# Patient Record
Sex: Female | Born: 1960 | Hispanic: No | Marital: Married | Smoking: Never smoker
Health system: Southern US, Community
[De-identification: ages and names within clinical notes are randomized; demographics above are authoritative.]

## PROBLEM LIST (undated history)

## (undated) DIAGNOSIS — M5136 Other intervertebral disc degeneration, lumbar region: Secondary | ICD-10-CM

## (undated) DIAGNOSIS — D649 Anemia, unspecified: Secondary | ICD-10-CM

## (undated) DIAGNOSIS — J189 Pneumonia, unspecified organism: Secondary | ICD-10-CM

## (undated) DIAGNOSIS — M51369 Other intervertebral disc degeneration, lumbar region without mention of lumbar back pain or lower extremity pain: Secondary | ICD-10-CM

## (undated) DIAGNOSIS — M5126 Other intervertebral disc displacement, lumbar region: Secondary | ICD-10-CM

## (undated) HISTORY — PX: OTHER SURGICAL HISTORY: SHX169

## (undated) HISTORY — PX: TUBAL LIGATION: SHX77

## (undated) HISTORY — PX: ANKLE SURGERY: SHX546

## (undated) HISTORY — PX: ABDOMINAL HYSTERECTOMY: SHX81

---

## 2008-12-09 ENCOUNTER — Emergency Department (HOSPITAL_COMMUNITY): Admission: EM | Admit: 2008-12-09 | Discharge: 2008-12-09 | Payer: Self-pay | Admitting: Emergency Medicine

## 2013-03-18 ENCOUNTER — Emergency Department (HOSPITAL_BASED_OUTPATIENT_CLINIC_OR_DEPARTMENT_OTHER)
Admission: EM | Admit: 2013-03-18 | Discharge: 2013-03-18 | Disposition: A | Discharge status: Home / Self Care | Attending: Emergency Medicine | Admitting: Emergency Medicine

## 2013-03-18 ENCOUNTER — Emergency Department (HOSPITAL_BASED_OUTPATIENT_CLINIC_OR_DEPARTMENT_OTHER)

## 2013-03-18 ENCOUNTER — Encounter (HOSPITAL_BASED_OUTPATIENT_CLINIC_OR_DEPARTMENT_OTHER): Payer: Self-pay

## 2013-03-18 DIAGNOSIS — D259 Leiomyoma of uterus, unspecified: Secondary | ICD-10-CM

## 2013-03-18 DIAGNOSIS — N898 Other specified noninflammatory disorders of vagina: Secondary | ICD-10-CM | POA: Insufficient documentation

## 2013-03-18 DIAGNOSIS — D649 Anemia, unspecified: Secondary | ICD-10-CM

## 2013-03-18 HISTORY — DX: Anemia, unspecified: D64.9

## 2013-03-18 LAB — BASIC METABOLIC PANEL
CO2: 24 mEq/L (ref 19–32)
Chloride: 107 mEq/L (ref 96–112)
Glucose, Bld: 90 mg/dL (ref 70–99)
Potassium: 3.9 mEq/L (ref 3.5–5.1)
Sodium: 140 mEq/L (ref 135–145)

## 2013-03-18 LAB — CBC WITH DIFFERENTIAL/PLATELET
Eosinophils Relative: 1 % (ref 0–5)
Lymphocytes Relative: 42 % (ref 12–46)
Lymphs Abs: 2.2 10*3/uL (ref 0.7–4.0)
MCV: 82.9 fL (ref 78.0–100.0)
Neutro Abs: 2.6 10*3/uL (ref 1.7–7.7)
Neutrophils Relative %: 48 % (ref 43–77)
Platelets: 280 10*3/uL (ref 150–400)
RBC: 3.16 MIL/uL — ABNORMAL LOW (ref 3.87–5.11)
WBC: 5.4 10*3/uL (ref 4.0–10.5)

## 2013-03-18 LAB — URINALYSIS, ROUTINE W REFLEX MICROSCOPIC
Bilirubin Urine: NEGATIVE
Glucose, UA: NEGATIVE mg/dL
Ketones, ur: 15 mg/dL — AB
Leukocytes, UA: NEGATIVE
Nitrite: NEGATIVE
Protein, ur: NEGATIVE mg/dL
Specific Gravity, Urine: 1.025 (ref 1.005–1.030)
Urobilinogen, UA: 1 mg/dL (ref 0.0–1.0)
pH: 6 (ref 5.0–8.0)

## 2013-03-18 LAB — URINE MICROSCOPIC-ADD ON

## 2013-03-18 LAB — WET PREP, GENITAL
Trich, Wet Prep: NONE SEEN
Yeast Wet Prep HPF POC: NONE SEEN

## 2013-03-18 MED ORDER — MORPHINE SULFATE 4 MG/ML IJ SOLN
4.0000 mg | Freq: Once | INTRAMUSCULAR | Status: AC
  Administered 2013-03-18: 4 mg via INTRAVENOUS
  Filled 2013-03-18: qty 1

## 2013-03-18 MED ORDER — HYDROCODONE-ACETAMINOPHEN 5-325 MG PO TABS: 2.0000 | ORAL_TABLET | ORAL | Status: DC | PRN

## 2013-03-18 MED ORDER — ONDANSETRON HCL 4 MG/2ML IJ SOLN
4.0000 mg | Freq: Once | INTRAMUSCULAR | Status: AC
  Administered 2013-03-18: 4 mg via INTRAVENOUS
  Filled 2013-03-18: qty 2

## 2013-03-18 NOTE — ED Provider Notes (Signed)
History     CSN: [id number]  Arrival date & time 03/18/13  1028   First MD Initiated Contact with Patient 03/18/13 1204      Chief Complaint  Patient presents with  . Abdominal Cramping  . Vaginal Bleeding    (Consider location/radiation/quality/duration/timing/severity/associated sxs/prior treatment) HPI Comments: Pt states that she is having intermittent lower abdominal pain with clotting which is abnormal for her:pt states that her period has been on going for 13 days:pt states that she has not had to use more then 6 tampons in 24 hours:no n/v/d, fever:pt states that she had an iron infusion on the 14th and she wasn't sure if this is related  Patient is a 52 y.o. female presenting with cramps and vaginal bleeding. The history is provided by the patient. No language interpreter was used.  Abdominal Cramping This is a new problem. The current episode started 1 to 4 weeks ago. The problem occurs intermittently. The problem has been gradually worsening. Pertinent negatives include no fever or urinary symptoms. Nothing aggravates the symptoms. She has tried nothing for the symptoms.  Vaginal Bleeding Pertinent negatives include no fever or urinary symptoms.    Past Medical History  Diagnosis Date  . Anemia     Past Surgical History  Procedure Laterality Date  . Tubal ligation    . Ankle surgery    . Arm surgery      No family history on file.  History  Substance Use Topics  . Smoking status: Never Smoker   . Smokeless tobacco: Not on file  . Alcohol Use: No    OB History   Grav Para Term Preterm Abortions TAB SAB Ect Mult Living                  Review of Systems  Constitutional: Negative for fever.  Respiratory: Negative.   Cardiovascular: Negative.   Genitourinary: Positive for vaginal bleeding.    Allergies  Review of patient's allergies indicates no known allergies.  Home Medications   Current Outpatient Rx  Name  Route  Sig  Dispense  Refill  .  Multiple Vitamins-Minerals (MULTIVITAMIN PO)   Oral   Take by mouth.           BP 111/68  Pulse 72  Temp(Src) 97.4 F (36.3 C) (Oral)  Resp 16  Ht 5\' 5"  (1.651 m)  Wt 138 lb (62.596 kg)  BMI 22.96 kg/m2  SpO2 100%  LMP 03/06/2013  Physical Exam  Nursing note and vitals reviewed. Constitutional: She is oriented to person, place, and time. She appears well-developed and well-nourished.  HENT:  Head: Normocephalic and atraumatic.  Eyes: Conjunctivae and EOM are normal.  Neck: Neck supple.  Cardiovascular: Normal rate and regular rhythm.   Pulmonary/Chest: Effort normal and breath sounds normal.  Abdominal: Soft. Bowel sounds are normal.  llq tenderness  Genitourinary:  Small amount of blood in vaginal vault  Musculoskeletal: Normal range of motion.  Neurological: She is alert and oriented to person, place, and time.  Skin: Skin is warm and dry.  Psychiatric: She has a normal mood and affect.    ED Course  Procedures (including critical care time)  Labs Reviewed  WET PREP, GENITAL - Abnormal; Notable for the following:    Clue Cells Wet Prep HPF POC MODERATE (*)    WBC, Wet Prep HPF POC MODERATE (*)    All other components within normal limits  URINALYSIS, ROUTINE W REFLEX MICROSCOPIC - Abnormal; Notable for the following:  Hgb urine dipstick TRACE (*)    Ketones, ur 15 (*)    All other components within normal limits  CBC WITH DIFFERENTIAL - Abnormal; Notable for the following:    RBC 3.16 (*)    Hemoglobin 8.0 (*)    HCT 26.2 (*)    MCH 25.3 (*)    RDW 20.8 (*)    All other components within normal limits  GC/CHLAMYDIA PROBE AMP  PREGNANCY, URINE  URINE MICROSCOPIC-ADD ON  BASIC METABOLIC PANEL   US Transvaginal Non-ob  03/18/2013  *RADIOLOGY REPORT*  Clinical Data: Pelvic pain, vaginal bleeding.  TRANSABDOMINAL AND TRANSVAGINAL ULTRASOUND OF PELVIS  Technique:  Both transabdominal and transvaginal ultrasound examinations of the pelvis were performed  including evaluation of the uterus, ovaries, adnexal regions, and pelvic cul-de-sac.  Comparison: None.  Findings:  Uterus: 11.4 x 8.9 x 9.4 cm.  Focal right fundal intramural solid lesion compatible with fibroid measuring up to 3.2 cm.  Endometrium: There is thickened, heterogeneous endometrium, measuring up to 4.9 cm in thickness.  Mixed echogenicity material within the endometrium could represent blood clot.  Right Ovary: 2.9 x 2.3 x 2.2 cm. Normal size and echotexture.  No adnexal masses.  Left Ovary: Not visualized.  No adnexal masses.  Other Findings:  Trace free fluid.  IMPRESSION: Markedly thickened, heterogeneous endometrium, question blood products within endometrial cavity.  Small right fundal fibroid   Original Report Authenticated By: Charlett Nose, M.D.      1. Uterine fibroid   2. Anemia       MDM  Pt is comfortable at this time:pt is okay to follow up with regional physicians:obtained records from [place] and pt had hg of 8.3 before he iron transfusion on 3/12        Teressa Lower, NP 03/18/13 1450

## 2013-03-18 NOTE — ED Provider Notes (Signed)
Medical screening examination/treatment/procedure(s) were performed by non-physician practitioner and as supervising physician I was immediately available for consultation/collaboration.   Dione Booze, MD 03/18/13 1504

## 2013-03-18 NOTE — ED Notes (Signed)
Pt reports vaginal bleeding and abdominal cramping that started on 03/09/2013.  She also reports passing clots.

## 2015-04-25 ENCOUNTER — Encounter (HOSPITAL_BASED_OUTPATIENT_CLINIC_OR_DEPARTMENT_OTHER): Payer: Self-pay

## 2015-04-25 ENCOUNTER — Emergency Department (HOSPITAL_BASED_OUTPATIENT_CLINIC_OR_DEPARTMENT_OTHER)
Admission: EM | Admit: 2015-04-25 | Discharge: 2015-04-25 | Disposition: A | Discharge status: Home / Self Care | Attending: Emergency Medicine | Admitting: Emergency Medicine

## 2015-04-25 DIAGNOSIS — M545 Low back pain: Secondary | ICD-10-CM | POA: Diagnosis present

## 2015-04-25 DIAGNOSIS — R109 Unspecified abdominal pain: Secondary | ICD-10-CM | POA: Insufficient documentation

## 2015-04-25 DIAGNOSIS — Z862 Personal history of diseases of the blood and blood-forming organs and certain disorders involving the immune mechanism: Secondary | ICD-10-CM | POA: Diagnosis not present

## 2015-04-25 LAB — URINALYSIS, ROUTINE W REFLEX MICROSCOPIC
Bilirubin Urine: NEGATIVE
Glucose, UA: NEGATIVE mg/dL
Hgb urine dipstick: NEGATIVE
Ketones, ur: NEGATIVE mg/dL
Leukocytes, UA: NEGATIVE
NITRITE: NEGATIVE
PH: 6 (ref 5.0–8.0)
Protein, ur: NEGATIVE mg/dL
SPECIFIC GRAVITY, URINE: 1.006 (ref 1.005–1.030)
Urobilinogen, UA: 1 mg/dL (ref 0.0–1.0)

## 2015-04-25 MED ORDER — DIAZEPAM 5 MG PO TABS
5.0000 mg | ORAL_TABLET | Freq: Once | ORAL | Status: AC
  Administered 2015-04-25: 5 mg via ORAL
  Filled 2015-04-25: qty 1

## 2015-04-25 MED ORDER — DIAZEPAM 5 MG PO TABS: 5.0000 mg | ORAL_TABLET | Freq: Four times a day (QID) | ORAL | Status: DC | PRN

## 2015-04-25 MED ORDER — ACETAMINOPHEN 325 MG PO TABS
650.0000 mg | ORAL_TABLET | Freq: Once | ORAL | Status: AC
  Administered 2015-04-25: 650 mg via ORAL
  Filled 2015-04-25: qty 2

## 2015-04-25 NOTE — ED Notes (Signed)
MD at bedside. 

## 2015-04-25 NOTE — ED Notes (Signed)
Pt reports back "spasms" on left side of back since Friday. Denies injrury. Reports she does work out. Also denies any urinary symptoms.

## 2015-04-25 NOTE — ED Provider Notes (Signed)
CSN: [id number]     Arrival date & time 04/25/15  0175 History   First MD Initiated Contact with Patient 04/25/15 [phone]     Chief Complaint  Patient presents with  . Back Pain     (Consider location/radiation/quality/duration/timing/severity/associated sxs/prior Treatment) HPI Comments: 54 year old female with no significant medical history, nonsmoker, no alcohol, exercises regularly presents with left lower back pain worse with movement intermittent since Friday. Pain improves with NSAIDs. No leg weakness numbness or urinary symptoms. No personal or family history of kidney stones. No abdominal surgeries. 9 your symptoms. Intermittent spasms. Patient has been doing specific workouts/pull-ups recently.  Patient is a 54 y.o. female presenting with back pain. The history is provided by the patient.  Back Pain Associated symptoms: no abdominal pain, no chest pain, no dysuria, no fever, no headaches, no numbness and no weakness     Past Medical History  Diagnosis Date  . Anemia    Past Surgical History  Procedure Laterality Date  . Tubal ligation    . Ankle surgery    . Arm surgery     No family history on file. History  Substance Use Topics  . Smoking status: Never Smoker   . Smokeless tobacco: Not on file  . Alcohol Use: No   OB History    No data available     Review of Systems  Constitutional: Negative for fever and chills.  HENT: Negative for congestion.   Eyes: Negative for visual disturbance.  Respiratory: Negative for shortness of breath.   Cardiovascular: Negative for chest pain.  Gastrointestinal: Negative for nausea, vomiting and abdominal pain.  Genitourinary: Negative for dysuria and flank pain.  Musculoskeletal: Positive for back pain. Negative for neck pain and neck stiffness.  Skin: Negative for rash.  Neurological: Negative for weakness, light-headedness, numbness and headaches.      Allergies  Review of patient's allergies indicates no known  allergies.  Home Medications   Prior to Admission medications   Medication Sig Start Date End Date Taking? Authorizing Provider  diazepam (VALIUM) 5 MG tablet Take 1 tablet (5 mg total) by mouth every 6 (six) hours as needed for muscle spasms (spasms). 04/25/15   Elnora Morrison, MD  HYDROcodone-acetaminophen (NORCO/VICODIN) 5-325 MG per tablet Take 2 tablets by mouth every 4 (four) hours as needed for pain. 03/18/13   Glendell Docker, NP  Multiple Vitamins-Minerals (MULTIVITAMIN PO) Take by mouth.    Historical Provider, MD   BP 105/66 mmHg  Pulse 54  Temp(Src) 97.9 F (36.6 C) (Oral)  Resp 14  Ht 5\' 6"  (1.676 m)  Wt 130 lb (58.968 kg)  BMI 20.99 kg/m2  SpO2 100%  LMP 03/06/2013 Physical Exam  Constitutional: She is oriented to person, place, and time. She appears well-developed and well-nourished.  HENT:  Head: Normocephalic and atraumatic.  Eyes: Conjunctivae are normal. Right eye exhibits no discharge. Left eye exhibits no discharge.  Neck: Normal range of motion. Neck supple. No tracheal deviation present.  Cardiovascular: Normal rate and regular rhythm.   Pulmonary/Chest: Effort normal and breath sounds normal.  Abdominal: Soft. She exhibits no distension. There is no tenderness. There is no guarding.  Musculoskeletal: She exhibits tenderness. She exhibits no edema.  Currently patient has minimal pain, mild discomfort left paraspinal region. No midline tenderness.  Neurological: She is alert and oriented to person, place, and time. GCS eye subscore is 4. GCS verbal subscore is 5. GCS motor subscore is 6.  Patient has 5+ strength with flexion extension of hips  knees and great toes. Sensation intact to major nerves bilateral lower extremities.  Skin: Skin is warm. No rash noted.  Psychiatric: She has a normal mood and affect.  Nursing note and vitals reviewed.   ED Course  Procedures (including critical care time) Emergency Focused Ultrasound Exam Limited retroperitoneal  ultrasound of kidneys  Performed and interpreted by Dr. Reather Converse Indication: flank pain Focused abdominal ultrasound with both kidneys imaged in transverse and longitudinal planes in real-time. Interpretation: no hydronephrosis visualized.   Images archived electronically  CPT Code: [phone] (limited retroperitoneal)  Labs Review Labs Reviewed  URINALYSIS, ROUTINE W REFLEX MICROSCOPIC    Imaging Review No results found.   EKG Interpretation None      MDM   Final diagnoses:  Left flank pain   Patient presents with clinical concern for musculoskeletal pain with recent workout and pain with range of motion and intermittent. Bedside ultrasound did not show significant hydronephrosis. Urinalysis pending. Discussed risks and benefits of CT scan, plan to hold on CT scan today with low pretest probability and treat supportively with close outpatient follow-up.  Results and differential diagnosis were discussed with the patient/parent/guardian. Close follow up outpatient was discussed, comfortable with the plan.   Medications  diazepam (VALIUM) tablet 5 mg (5 mg Oral Given 04/25/15 0814)  acetaminophen (TYLENOL) tablet 650 mg (650 mg Oral Given 04/25/15 0814)    Filed Vitals:   04/25/15 0750  BP: 105/66  Pulse: 54  Temp: 97.9 F (36.6 C)  TempSrc: Oral  Resp: 14  Height: 5\' 6"  (1.676 m)  Weight: 130 lb (58.968 kg)  SpO2: 100%    Final diagnoses:  Left flank pain      Elnora Morrison, MD 04/25/15 [phone]

## 2015-04-25 NOTE — ED Notes (Signed)
Pt reports left lower back pain. Reports she does workout on a daily basis.

## 2015-04-25 NOTE — Discharge Instructions (Signed)
Take ibuprofen and Tylenol for pain as needed. Take Valium for muscle spasm, decreased workouts for the next 48 hours to monitor for improvement. Return for further workup and possible imaging if no improvement.  If you were given medicines take as directed.  If you are on coumadin or contraceptives realize their levels and effectiveness is altered by many different medicines.  If you have any reaction (rash, tongues swelling, other) to the medicines stop taking and see a physician.   Please follow up as directed and return to the ER or see a physician for new or worsening symptoms.  Thank you. Filed Vitals:   04/25/15 0750  BP: 105/66  Pulse: 54  Temp: 97.9 F (36.6 C)  TempSrc: Oral  Resp: 14  Height: 5\' 6"  (1.676 m)  Weight: 130 lb (58.968 kg)  SpO2: 100%

## 2015-05-05 ENCOUNTER — Emergency Department (HOSPITAL_BASED_OUTPATIENT_CLINIC_OR_DEPARTMENT_OTHER)
Admission: EM | Admit: 2015-05-05 | Discharge: 2015-05-06 | Disposition: A | Discharge status: Home / Self Care | Attending: Emergency Medicine | Admitting: Emergency Medicine

## 2015-05-05 ENCOUNTER — Encounter (HOSPITAL_BASED_OUTPATIENT_CLINIC_OR_DEPARTMENT_OTHER): Payer: Self-pay | Admitting: *Deleted

## 2015-05-05 DIAGNOSIS — J159 Unspecified bacterial pneumonia: Secondary | ICD-10-CM | POA: Diagnosis not present

## 2015-05-05 DIAGNOSIS — R109 Unspecified abdominal pain: Secondary | ICD-10-CM

## 2015-05-05 DIAGNOSIS — R1011 Right upper quadrant pain: Secondary | ICD-10-CM | POA: Diagnosis present

## 2015-05-05 DIAGNOSIS — Z862 Personal history of diseases of the blood and blood-forming organs and certain disorders involving the immune mechanism: Secondary | ICD-10-CM | POA: Insufficient documentation

## 2015-05-05 DIAGNOSIS — J189 Pneumonia, unspecified organism: Secondary | ICD-10-CM

## 2015-05-05 DIAGNOSIS — Z9851 Tubal ligation status: Secondary | ICD-10-CM | POA: Insufficient documentation

## 2015-05-05 NOTE — ED Notes (Signed)
Pt c/o left flank pain x 1 week seen here 5/2  For same

## 2015-05-06 ENCOUNTER — Emergency Department (HOSPITAL_BASED_OUTPATIENT_CLINIC_OR_DEPARTMENT_OTHER)

## 2015-05-06 LAB — CBC WITH DIFFERENTIAL/PLATELET
BASOS PCT: 0 % (ref 0–1)
Basophils Absolute: 0 10*3/uL (ref 0.0–0.1)
Eosinophils Absolute: 0.1 10*3/uL (ref 0.0–0.7)
Eosinophils Relative: 1 % (ref 0–5)
HCT: 36.1 % (ref 36.0–46.0)
HEMOGLOBIN: 12.1 g/dL (ref 12.0–15.0)
LYMPHS ABS: 2.2 10*3/uL (ref 0.7–4.0)
Lymphocytes Relative: 38 % (ref 12–46)
MCH: 30.3 pg (ref 26.0–34.0)
MCHC: 33.5 g/dL (ref 30.0–36.0)
MCV: 90.3 fL (ref 78.0–100.0)
MONO ABS: 0.6 10*3/uL (ref 0.1–1.0)
Monocytes Relative: 11 % (ref 3–12)
NEUTROS ABS: 2.9 10*3/uL (ref 1.7–7.7)
NEUTROS PCT: 50 % (ref 43–77)
PLATELETS: 351 10*3/uL (ref 150–400)
RBC: 4 MIL/uL (ref 3.87–5.11)
RDW: 13.8 % (ref 11.5–15.5)
WBC: 5.9 10*3/uL (ref 4.0–10.5)

## 2015-05-06 LAB — COMPREHENSIVE METABOLIC PANEL
ALBUMIN: 3.9 g/dL (ref 3.5–5.0)
ALT: 63 U/L — AB (ref 14–54)
ANION GAP: 7 (ref 5–15)
AST: 49 U/L — ABNORMAL HIGH (ref 15–41)
Alkaline Phosphatase: 104 U/L (ref 38–126)
BILIRUBIN TOTAL: 0.5 mg/dL (ref 0.3–1.2)
BUN: 12 mg/dL (ref 6–20)
CALCIUM: 9.3 mg/dL (ref 8.9–10.3)
CO2: 26 mmol/L (ref 22–32)
CREATININE: 0.63 mg/dL (ref 0.44–1.00)
Chloride: 107 mmol/L (ref 101–111)
GLUCOSE: 100 mg/dL — AB (ref 65–99)
POTASSIUM: 4.2 mmol/L (ref 3.5–5.1)
SODIUM: 140 mmol/L (ref 135–145)
TOTAL PROTEIN: 7.1 g/dL (ref 6.5–8.1)

## 2015-05-06 LAB — URINALYSIS, ROUTINE W REFLEX MICROSCOPIC
Bilirubin Urine: NEGATIVE
GLUCOSE, UA: NEGATIVE mg/dL
Hgb urine dipstick: NEGATIVE
Ketones, ur: NEGATIVE mg/dL
NITRITE: NEGATIVE
PH: 8 (ref 5.0–8.0)
PROTEIN: NEGATIVE mg/dL
Specific Gravity, Urine: 1.023 (ref 1.005–1.030)
UROBILINOGEN UA: 1 mg/dL (ref 0.0–1.0)

## 2015-05-06 LAB — URINE MICROSCOPIC-ADD ON

## 2015-05-06 LAB — TROPONIN I: Troponin I: <0.03 ng/mL (ref <0.031)

## 2015-05-06 LAB — LIPASE, BLOOD: Lipase: 46 U/L (ref 22–51)

## 2015-05-06 MED ORDER — IOHEXOL 300 MG/ML  SOLN
100.0000 mL | Freq: Once | INTRAMUSCULAR | Status: AC | PRN
  Administered 2015-05-06: 100 mL via INTRAVENOUS

## 2015-05-06 MED ORDER — KETOROLAC TROMETHAMINE 30 MG/ML IJ SOLN
30.0000 mg | Freq: Once | INTRAMUSCULAR | Status: AC
  Administered 2015-05-06: 30 mg via INTRAVENOUS
  Filled 2015-05-06: qty 1

## 2015-05-06 MED ORDER — AZITHROMYCIN 250 MG PO TABS: 250.0000 mg | ORAL_TABLET | Freq: Every day | ORAL | Status: DC

## 2015-05-06 MED ORDER — SODIUM CHLORIDE 0.9 % IV BOLUS (SEPSIS)
1000.0000 mL | Freq: Once | INTRAVENOUS | Status: AC
  Administered 2015-05-06: 1000 mL via INTRAVENOUS

## 2015-05-06 MED ORDER — IOHEXOL 350 MG/ML SOLN
80.0000 mL | Freq: Once | INTRAVENOUS | Status: AC | PRN
  Administered 2015-05-06: 80 mL via INTRAVENOUS

## 2015-05-06 NOTE — ED Notes (Signed)
Pt c/o left flank pain off and on x 2 weeks,  Has been seen here for same,  Pain had gone away till pt start back to running,  Pain increased w movement, and inspiration,  Denies urinary sx

## 2015-05-06 NOTE — Discharge Instructions (Signed)

## 2015-05-06 NOTE — ED Provider Notes (Signed)
CSN: [id number]     Arrival date & time 05/05/15  2343 History   First MD Initiated Contact with Patient 05/06/15 0019     Chief Complaint  Patient presents with  . Flank Pain     (Consider location/radiation/quality/duration/timing/severity/associated sxs/prior Treatment) Patient is a 54 y.o. female presenting with flank pain.  Flank Pain This is a new problem. The current episode started 12 to 24 hours ago. The problem occurs constantly. The problem has not changed since onset.Associated symptoms include abdominal pain. Pertinent negatives include no chest pain, no headaches and no shortness of breath. The symptoms are aggravated by walking and twisting. The symptoms are relieved by rest.    Past Medical History  Diagnosis Date  . Anemia    Past Surgical History  Procedure Laterality Date  . Tubal ligation    . Ankle surgery    . Arm surgery     History reviewed. No pertinent family history. History  Substance Use Topics  . Smoking status: Never Smoker   . Smokeless tobacco: Not on file  . Alcohol Use: No   OB History    No data available     Review of Systems  Respiratory: Negative for shortness of breath.   Cardiovascular: Negative for chest pain.  Gastrointestinal: Positive for abdominal pain.  Genitourinary: Positive for flank pain.  Neurological: Negative for headaches.  All other systems reviewed and are negative.     Allergies  Review of patient's allergies indicates no known allergies.  Home Medications   Prior to Admission medications   Medication Sig Start Date End Date Taking? Authorizing Provider  azithromycin (ZITHROMAX) 250 MG tablet Take 1 tablet (250 mg total) by mouth daily. Take first 2 tablets together, then 1 every day until finished. 05/06/15   Debby Freiberg, MD  diazepam (VALIUM) 5 MG tablet Take 1 tablet (5 mg total) by mouth every 6 (six) hours as needed for muscle spasms (spasms). 04/25/15   Elnora Morrison, MD  HYDROcodone-acetaminophen  (NORCO/VICODIN) 5-325 MG per tablet Take 2 tablets by mouth every 4 (four) hours as needed for pain. 03/18/13   Glendell Docker, NP  Multiple Vitamins-Minerals (MULTIVITAMIN PO) Take by mouth.    Historical Provider, MD   BP 97/60 mmHg  Pulse 60  Temp(Src) 97.8 F (36.6 C) (Oral)  Resp 20  Ht 5\' 6"  (1.676 m)  Wt 135 lb (61.236 kg)  BMI 21.80 kg/m2  SpO2 98%  LMP 03/06/2013 Physical Exam  Constitutional: She is oriented to person, place, and time. She appears well-developed and well-nourished.  HENT:  Head: Normocephalic and atraumatic.  Right Ear: External ear normal.  Left Ear: External ear normal.  Eyes: Conjunctivae and EOM are normal. Pupils are equal, round, and reactive to light.  Neck: Normal range of motion. Neck supple.  Cardiovascular: Normal rate, regular rhythm, normal heart sounds and intact distal pulses.   Pulmonary/Chest: Effort normal and breath sounds normal.  Abdominal: Soft. Bowel sounds are normal. There is tenderness in the right upper quadrant, epigastric area, left upper quadrant and left lower quadrant. There is CVA tenderness (L).  Musculoskeletal: Normal range of motion.       Cervical back: Normal.       Thoracic back: Normal.       Lumbar back: Normal.  Neurological: She is alert and oriented to person, place, and time.  Skin: Skin is warm and dry.  Vitals reviewed.   ED Course  Procedures (including critical care time) Labs Review Labs Reviewed  URINALYSIS, ROUTINE W REFLEX MICROSCOPIC - Abnormal; Notable for the following:    Leukocytes, UA TRACE (*)    All other components within normal limits  COMPREHENSIVE METABOLIC PANEL - Abnormal; Notable for the following:    Glucose, Bld 100 (*)    AST 49 (*)    ALT 63 (*)    All other components within normal limits  URINE CULTURE  URINE MICROSCOPIC-ADD ON  CBC WITH DIFFERENTIAL/PLATELET  LIPASE, BLOOD  TROPONIN I    Imaging Review Ct Angio Chest Pe W/cm &/or Wo Cm  05/06/2015   CLINICAL  DATA:  Left flank pain off and on for 2 weeks. Left chest pain.  EXAM: CT ANGIOGRAPHY CHEST WITH CONTRAST  TECHNIQUE: Multidetector CT imaging of the chest was performed using the standard protocol during bolus administration of intravenous contrast. Multiplanar CT image reconstructions and MIPs were obtained to evaluate the vascular anatomy.  CONTRAST:  68mL OMNIPAQUE IOHEXOL 350 MG/ML SOLN  COMPARISON:  None.  FINDINGS: Technically adequate study with good opacification of the central and segmental pulmonary arteries. No focal filling defects. No evidence of significant pulmonary embolus.  Mild cardiac enlargement. Small pericardial effusion. Normal caliber thoracic aorta. No evidence of aortic dissection. Great vessel origins are patent. Esophagus is decompressed. No significant lymphadenopathy in the chest. Diffuse thyroid enlargement with 14 mm nodule in the left thyroid. Consider ultrasound further evaluation.  Moderate size left pleural effusion with basilar atelectasis and infiltration. Appearance may represent pneumonia. Mild atelectasis or infiltration in the right lung base. Emphysematous changes in the lungs. No pneumothorax. Airways appear patent.  Review of the MIP images confirms the above findings.  IMPRESSION: No evidence of significant pulmonary embolus. Small pericardial and moderate left pleural effusions. Atelectasis and infiltration in the left lung base suggesting pneumonia. Thyroid enlargement with 14 mm thyroid nodule. Consider ultrasound for further evaluation.   Electronically Signed   By: Lucienne Capers M.D.   On: 05/06/2015 03:27   Ct Abdomen Pelvis W Contrast  05/06/2015   CLINICAL DATA:  Left flank pain off and on for 2 weeks. Has been seen here for same. Pain had gone away but now increasing.  EXAM: CT ABDOMEN AND PELVIS WITH CONTRAST  TECHNIQUE: Multidetector CT imaging of the abdomen and pelvis was performed using the standard protocol following bolus administration of  intravenous contrast.  CONTRAST:  157mL OMNIPAQUE IOHEXOL 300 MG/ML  SOLN  COMPARISON:  None.  FINDINGS: Small left pleural effusion with basilar atelectasis and possible infiltration. Changes may indicate pneumonia. Clinical correlation is recommended. Small pericardial effusion.  The liver, spleen, gallbladder, pancreas, adrenal glands, kidneys, abdominal aorta, inferior vena cava, and retroperitoneal lymph nodes are unremarkable. Stomach and small bowel are not abnormally distended. Diffusely stool-filled colon with mild dilatation suggesting constipation. No wall thickening is appreciated. No free air or free fluid in the abdomen. Abdominal wall musculature appears intact.  Pelvis: Small amount of free fluid in the pelvis is nonspecific but may be physiologic. Uterus appears surgically absent. Ovaries are not enlarged. Bladder wall is not abnormally thickened. Appendix is normal. No inflammatory changes at the sigmoid colon. No destructive bone lesions.  IMPRESSION: Left pleural effusion with basilar atelectasis and infiltration suggesting pneumonia. Small pericardial effusion. Prominent diffusely stool-filled colon suggesting constipation. No acute process otherwise identified in the abdomen or pelvis.   Electronically Signed   By: Lucienne Capers M.D.   On: 05/06/2015 02:25     EKG Interpretation   Date/Time:  Friday May 06 2015 03:50:18  EDT Ventricular Rate:  58 PR Interval:  154 QRS Duration: 72 QT Interval:  402 QTC Calculation: 394 R Axis:   16 Text Interpretation:  Sinus bradycardia Low voltage QRS Borderline ECG No  old tracing to compare Confirmed by Debby Freiberg [phone]) on 05/06/2015  3:54:29 AM      MDM   Final diagnoses:  Abdominal pain  CAP (community acquired pneumonia)    54 y.o. female with pertinent PMH of prior anemia, recent visit for back pain presents with recurrent back pain, abd pain, chest pain.  On arrival patient has vital signs and physical exam as above.  No historical or exam elements of cauda equina. Pain and tenderness present primarily in abdomen and chest wall.  Workup as above with negative CT scan of abdomen, but with concern for potential pneumonia. Obtained CT scan of chest due to possibility of PE given pleuritic chest pain, absence of significant cough on history or fevers. This demonstrated repeated pneumonia, without PE. I discussed with the patient further and she states that she has had some cough in the morning. She denies fever. She is well-appearing, not tachypneic, not hypoxic. Feel her stable for outpatient therapy with azithromycin and PCP follow-up. Patient voiced understanding of precautions, agreed to follow-up.    I have reviewed all laboratory and imaging studies if ordered as above  1. CAP (community acquired pneumonia)   2. Abdominal pain         Debby Freiberg, MD 05/06/15 [phone]

## 2015-05-07 LAB — URINE CULTURE
Colony Count: NO GROWTH
Culture: NO GROWTH

## 2017-06-13 ENCOUNTER — Emergency Department (HOSPITAL_BASED_OUTPATIENT_CLINIC_OR_DEPARTMENT_OTHER)
Admission: EM | Admit: 2017-06-13 | Discharge: 2017-06-13 | Disposition: A | Discharge status: Home / Self Care | Attending: Emergency Medicine | Admitting: Emergency Medicine

## 2017-06-13 ENCOUNTER — Emergency Department (HOSPITAL_BASED_OUTPATIENT_CLINIC_OR_DEPARTMENT_OTHER)

## 2017-06-13 ENCOUNTER — Encounter (HOSPITAL_BASED_OUTPATIENT_CLINIC_OR_DEPARTMENT_OTHER): Payer: Self-pay | Admitting: *Deleted

## 2017-06-13 DIAGNOSIS — Y939 Activity, unspecified: Secondary | ICD-10-CM | POA: Diagnosis not present

## 2017-06-13 DIAGNOSIS — S39012A Strain of muscle, fascia and tendon of lower back, initial encounter: Secondary | ICD-10-CM | POA: Insufficient documentation

## 2017-06-13 DIAGNOSIS — S3992XA Unspecified injury of lower back, initial encounter: Secondary | ICD-10-CM | POA: Diagnosis present

## 2017-06-13 DIAGNOSIS — Y999 Unspecified external cause status: Secondary | ICD-10-CM | POA: Insufficient documentation

## 2017-06-13 DIAGNOSIS — Y92410 Unspecified street and highway as the place of occurrence of the external cause: Secondary | ICD-10-CM | POA: Diagnosis not present

## 2017-06-13 DIAGNOSIS — Z79899 Other long term (current) drug therapy: Secondary | ICD-10-CM | POA: Diagnosis not present

## 2017-06-13 HISTORY — DX: Pneumonia, unspecified organism: J18.9

## 2017-06-13 MED ORDER — METHOCARBAMOL 500 MG PO TABS
750.0000 mg | ORAL_TABLET | Freq: Once | ORAL | Status: AC
  Administered 2017-06-13: 750 mg via ORAL
  Filled 2017-06-13: qty 2

## 2017-06-13 MED ORDER — METHOCARBAMOL 500 MG PO TABS: 1000.0000 mg | ORAL_TABLET | Freq: Four times a day (QID) | ORAL | 0 refills | Status: DC | PRN

## 2017-06-13 MED ORDER — IBUPROFEN 400 MG PO TABS
400.0000 mg | ORAL_TABLET | Freq: Once | ORAL | Status: AC
  Administered 2017-06-13: 400 mg via ORAL
  Filled 2017-06-13: qty 1

## 2017-06-13 MED FILL — METHOCARBAMOL 500 MG TABLET: 500 | 3 days supply | Qty: 20 | Fill #0
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
## 2017-06-13 NOTE — Discharge Instructions (Signed)
For pain control you may take up to 800mg  of Motrin (also known as ibuprofen). That is usually 4 over the counter pills,  3 times a day. Take with food to minimize stomach irritation    For breakthrough pain you may take Robaxin. Do not drink alcohol, drive or operate heavy machinery when taking Robaxin.  Please follow with your primary care doctor in the next 2 days for a check-up. They must obtain records for further management.   Do not hesitate to return to the Emergency Department for any new, worsening or concerning symptoms.

## 2017-06-13 NOTE — ED Notes (Signed)
ED Provider at bedside. 
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
## 2017-07-19 ENCOUNTER — Telehealth: Payer: Self-pay | Admitting: *Deleted

## 2017-07-19 NOTE — Telephone Encounter (Signed)
Received request for Medical Records from [employer], [employer], forwarded to [place] for email/scan/SLS 07/27

## 2018-01-29 ENCOUNTER — Encounter (HOSPITAL_BASED_OUTPATIENT_CLINIC_OR_DEPARTMENT_OTHER): Payer: Self-pay

## 2018-01-29 ENCOUNTER — Emergency Department (HOSPITAL_BASED_OUTPATIENT_CLINIC_OR_DEPARTMENT_OTHER)
Admission: EM | Admit: 2018-01-29 | Discharge: 2018-01-29 | Disposition: A | Discharge status: Home / Self Care | Attending: Emergency Medicine | Admitting: Emergency Medicine

## 2018-01-29 ENCOUNTER — Other Ambulatory Visit: Payer: Self-pay

## 2018-01-29 DIAGNOSIS — G8929 Other chronic pain: Secondary | ICD-10-CM | POA: Diagnosis not present

## 2018-01-29 DIAGNOSIS — M545 Low back pain: Secondary | ICD-10-CM | POA: Diagnosis not present

## 2018-01-29 HISTORY — DX: Other intervertebral disc degeneration, lumbar region without mention of lumbar back pain or lower extremity pain: M51.369

## 2018-01-29 HISTORY — DX: Other intervertebral disc degeneration, lumbar region: M51.36

## 2018-01-29 HISTORY — DX: Other intervertebral disc displacement, lumbar region: M51.26

## 2018-01-29 MED ORDER — LIDOCAINE 5 % EX PTCH: 1.0000 | MEDICATED_PATCH | CUTANEOUS | 0 refills | Status: AC

## 2018-01-29 MED ORDER — DICLOFENAC SODIUM 1 % TD GEL: 2.0000 g | Freq: Four times a day (QID) | TRANSDERMAL | 0 refills | Status: AC

## 2018-01-29 MED ORDER — TRAMADOL HCL 50 MG PO TABS: 50.0000 mg | ORAL_TABLET | Freq: Four times a day (QID) | ORAL | 0 refills | Status: AC | PRN

## 2018-01-29 MED FILL — traMADol HCL 50 MG TABS: 50 | 2 days supply | Qty: 10 | Fill #0

## 2018-01-29 MED FILL — LIDOCAINE PATCH 5%: 5 | 30 days supply | Qty: 30 | Fill #0

## 2018-01-29 MED FILL — DICLOFENAC SODIUM 1% GEL: 1 | 13 days supply | Qty: 100 | Fill #0

## 2018-01-29 NOTE — Discharge Instructions (Signed)
Workup has been normal. Please take medications as prescribed and instructed.  Please take the voltaren as prescribed for pain. Do not take any additional NSAIDs including Motrin, Aleve, Ibuprofen, Advil.  Haven given you tramadol to take for pain this medication will make you drowsy so do not drive with it.    SEEK IMMEDIATE MEDICAL ATTENTION IF: New numbness, tingling, weakness, or problem with the use of your arms or legs.  Severe back pain not relieved with medications.  Change in bowel or bladder control.  Urinary retention.  Numbness in your groin.  Increasing pain in any areas of the body (such as chest or abdominal pain).  Shortness of breath, dizziness or fainting.  Nausea (feeling sick to your stomach), vomiting, fever, or sweats.

## 2018-01-29 NOTE — ED Notes (Signed)
ED Provider at bedside. 
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
